# Patient Record
Sex: Male | Born: 1975 | Race: White | Hispanic: No | Marital: Married | Smoking: Never smoker
Health system: Southern US, Community
[De-identification: ages and names within clinical notes are randomized; demographics above are authoritative.]

## PROBLEM LIST (undated history)

## (undated) DIAGNOSIS — G35 Multiple sclerosis: Secondary | ICD-10-CM

---

## 2021-04-18 ENCOUNTER — Emergency Department (HOSPITAL_COMMUNITY): Payer: Medicare HMO

## 2021-04-18 ENCOUNTER — Emergency Department (HOSPITAL_COMMUNITY)
Admission: EM | Admit: 2021-04-18 | Discharge: 2021-04-19 | Disposition: A | Discharge status: Home / Self Care | Payer: Medicare HMO | Attending: Emergency Medicine | Admitting: Emergency Medicine

## 2021-04-18 ENCOUNTER — Other Ambulatory Visit: Payer: Self-pay

## 2021-04-18 ENCOUNTER — Encounter (HOSPITAL_COMMUNITY): Payer: Self-pay | Admitting: Emergency Medicine

## 2021-04-18 DIAGNOSIS — R471 Dysarthria and anarthria: Secondary | ICD-10-CM | POA: Insufficient documentation

## 2021-04-18 DIAGNOSIS — R531 Weakness: Secondary | ICD-10-CM | POA: Diagnosis present

## 2021-04-18 DIAGNOSIS — G35 Multiple sclerosis: Secondary | ICD-10-CM | POA: Insufficient documentation

## 2021-04-18 HISTORY — DX: Multiple sclerosis: G35

## 2021-04-18 LAB — BASIC METABOLIC PANEL
Anion gap: 10 (ref 5–15)
BUN: 12 mg/dL (ref 6–20)
CO2: 26 mmol/L (ref 22–32)
Calcium: 10.4 mg/dL — ABNORMAL HIGH (ref 8.9–10.3)
Chloride: 106 mmol/L (ref 98–111)
Creatinine, Ser: 0.93 mg/dL (ref 0.61–1.24)
GFR, Estimated: >60 mL/min (ref >60)
Glucose, Bld: 138 mg/dL — ABNORMAL HIGH (ref 70–99)
Potassium: 4.3 mmol/L (ref 3.5–5.1)
Sodium: 142 mmol/L (ref 135–145)

## 2021-04-18 LAB — CBC WITH DIFFERENTIAL/PLATELET
Abs Immature Granulocytes: 0.06 10*3/uL (ref 0.00–0.07)
Basophils Absolute: 0 10*3/uL (ref 0.0–0.1)
Basophils Relative: 0 %
Eosinophils Absolute: 0 10*3/uL (ref 0.0–0.5)
Eosinophils Relative: 0 %
HCT: 45.7 % (ref 39.0–52.0)
Hemoglobin: 15.4 g/dL (ref 13.0–17.0)
Immature Granulocytes: 1 %
Lymphocytes Relative: 9 %
Lymphs Abs: 1 10*3/uL (ref 0.7–4.0)
MCH: 31.9 pg (ref 26.0–34.0)
MCHC: 33.7 g/dL (ref 30.0–36.0)
MCV: 94.6 fL (ref 80.0–100.0)
Monocytes Absolute: 0.2 10*3/uL (ref 0.1–1.0)
Monocytes Relative: 2 %
Neutro Abs: 9.6 10*3/uL — ABNORMAL HIGH (ref 1.7–7.7)
Neutrophils Relative %: 88 %
Platelets: 357 10*3/uL (ref 150–400)
RBC: 4.83 MIL/uL (ref 4.22–5.81)
RDW: 13.4 % (ref 11.5–15.5)
WBC: 10.8 10*3/uL — ABNORMAL HIGH (ref 4.0–10.5)
nRBC: 0 % (ref 0.0–0.2)

## 2021-04-18 LAB — HEPATIC FUNCTION PANEL
ALT: 12 U/L (ref 0–44)
AST: 14 U/L — ABNORMAL LOW (ref 15–41)
Albumin: 4.8 g/dL (ref 3.5–5.0)
Alkaline Phosphatase: 61 U/L (ref 38–126)
Bilirubin, Direct: <0.1 mg/dL (ref 0.0–0.2)
Total Bilirubin: 0.3 mg/dL (ref 0.3–1.2)
Total Protein: 7.4 g/dL (ref 6.5–8.1)

## 2021-04-18 IMAGING — MR MR CERVICAL SPINE WO/W CM
7 of 8 series · 32 of 48 positions shown · IV contrast (Gadavist)
Comparison: None.

CLINICAL DATA: Multiple sclerosis

EXAM:
MRI CERVICAL SPINE WITHOUT AND WITH CONTRAST
TECHNIQUE: Multiplanar and multiecho pulse sequences of the cervical spine, to
include the craniocervical junction and cervicothoracic junction,
were obtained without and with intravenous contrast.
CONTRAST:  8mL GADAVIST GADOBUTROL 1 MMOL/ML IV SOLN

[Series 9: T2 · sagittal · 3.0mm · 0.69mm/px · 3 of 15 slices shown (1 of 2)]
[im 1/15]
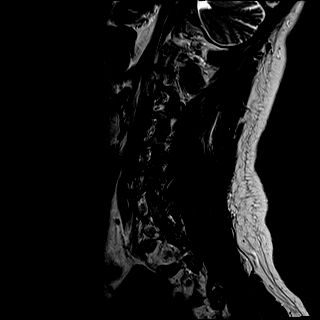
[im 8/15]
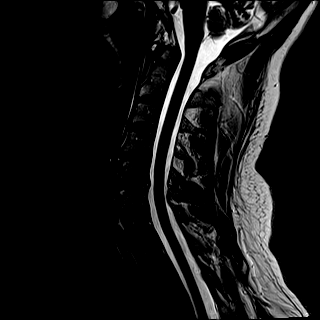
[im 15/15]
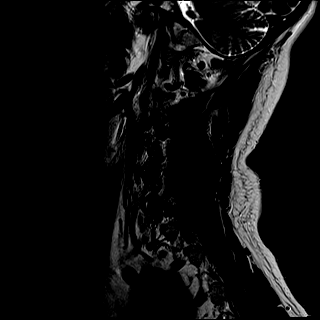

[Series 10: T1 · sagittal · 3.0mm · 0.69mm/px · 3 of 15 slices shown (1 of 2)]
[im 1/15]
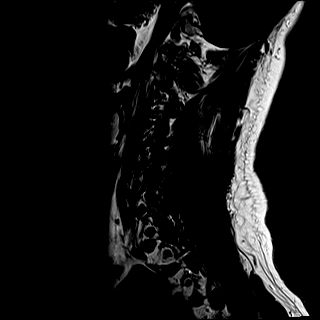
[im 8/15]
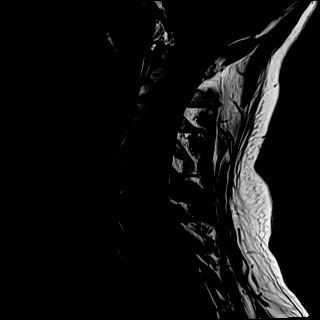
[im 15/15]
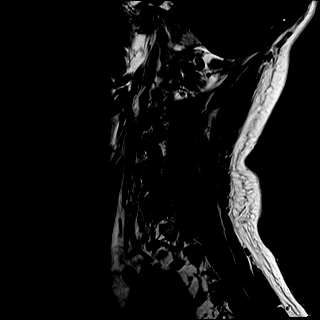

[Series 11: STIR · sagittal · 3.0mm · 0.86mm/px · 3 of 15 slices shown]
[im 1/15]
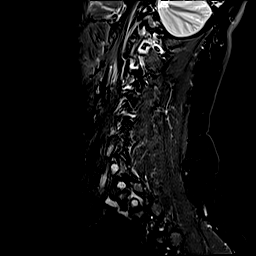
[im 8/15]
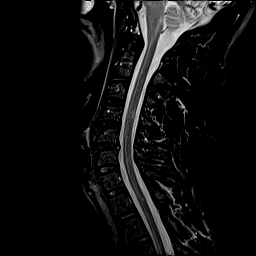
[im 15/15]
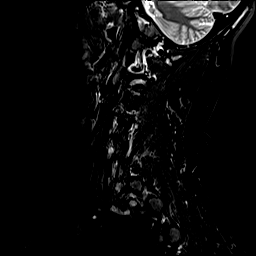

[Series 12: T2 · axial · 3.0mm · 0.66mm/px · z∈[-220,-92]mm · 9 of 41 slices shown (2 of 2)]
[im 1/41]
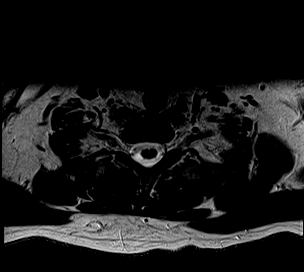
[im 6/41]
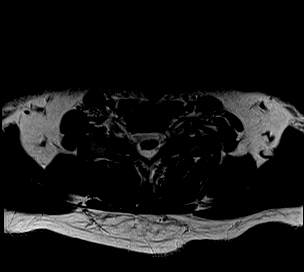
[im 11/41]
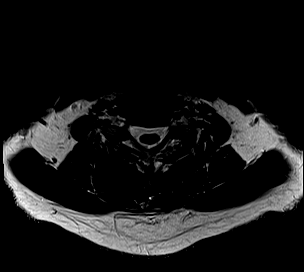
[im 16/41]
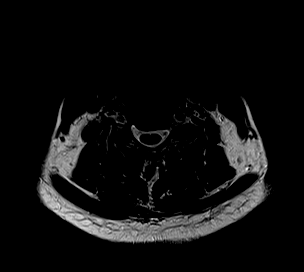
[im 21/41]
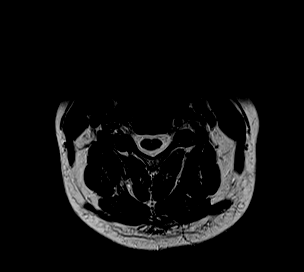
[im 26/41]
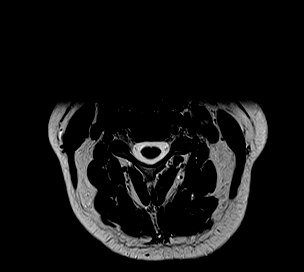
[im 31/41]
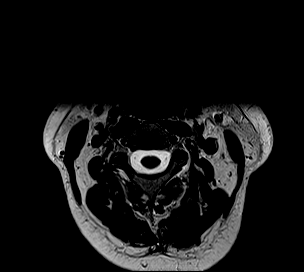
[im 36/41]
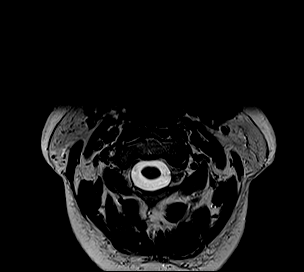
[im 41/41]
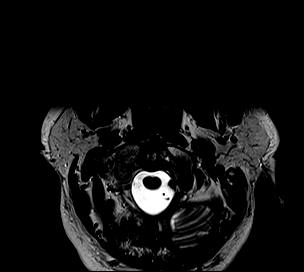

[Series 14: T1 · axial · 3.0mm · 0.39mm/px · z∈[-220,-92]mm · 9 of 42 slices shown (2 of 2)]
[im 1/42]
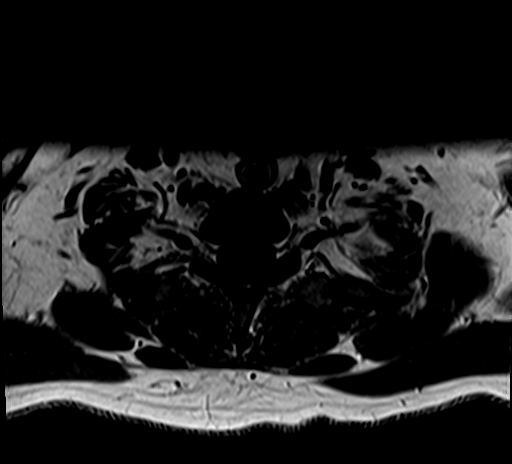
[im 6/42]
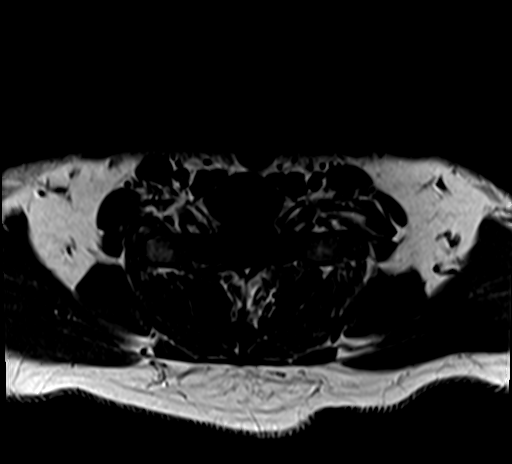
[im 11/42]
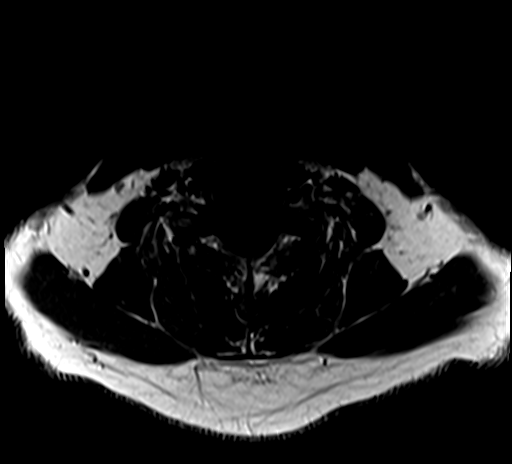
[im 16/42]
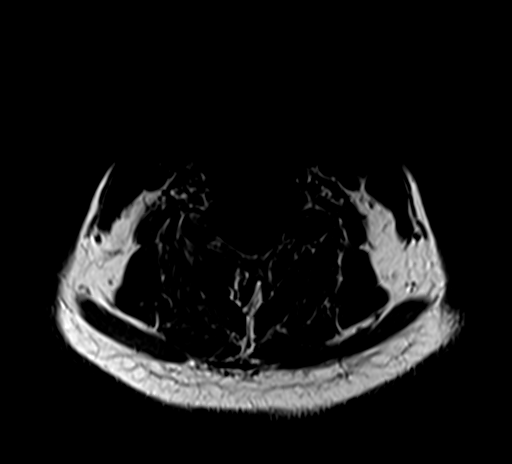
[im 21/42]
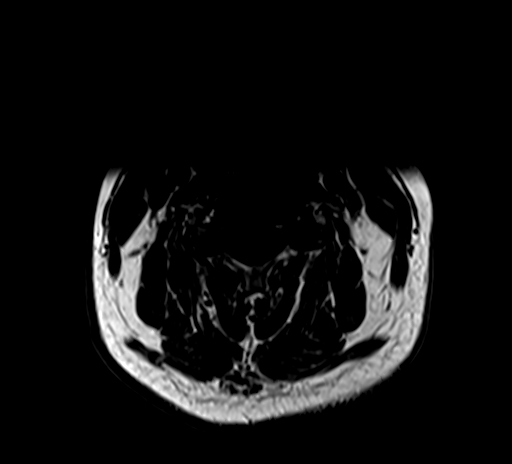
[im 26/42]
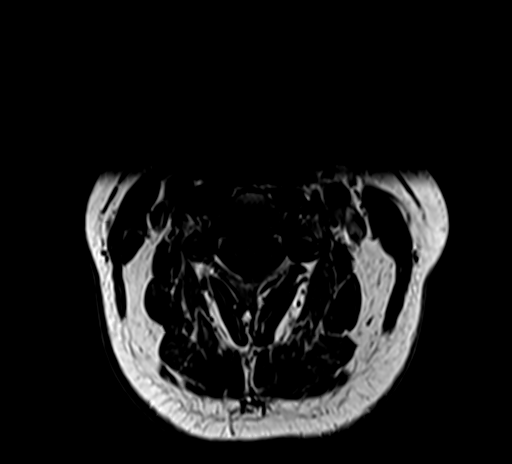
[im 31/42]
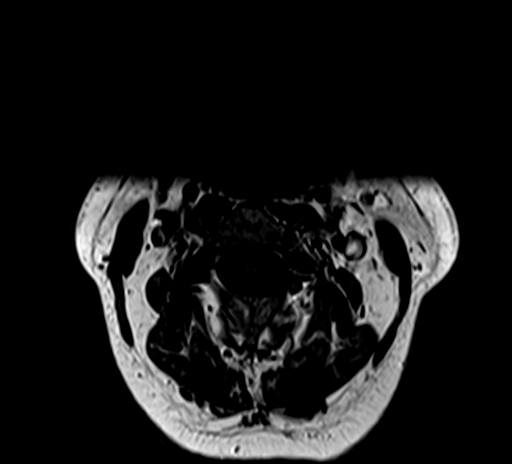
[im 36/42]
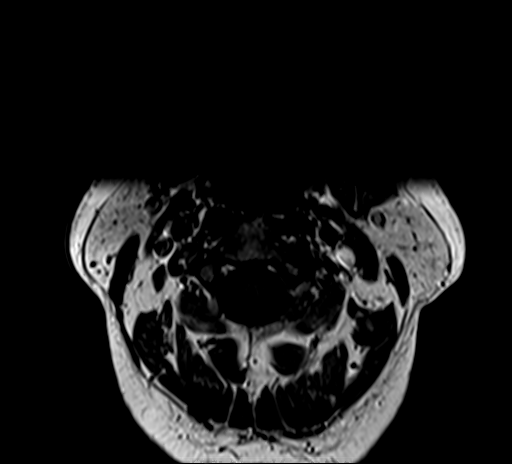
[im 42/42]
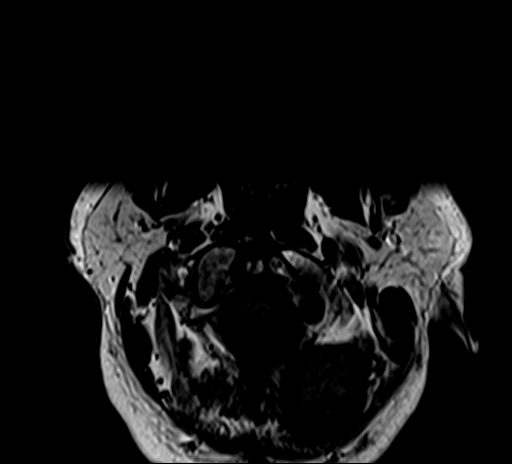

[Series 15: T1 fat-sat post-contrast · sagittal · 3.0mm · 0.43mm/px · 3 of 15 slices shown]
[im 1/15]
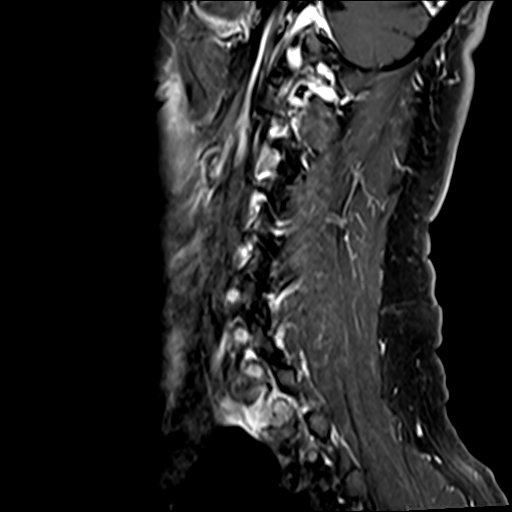
[im 8/15]
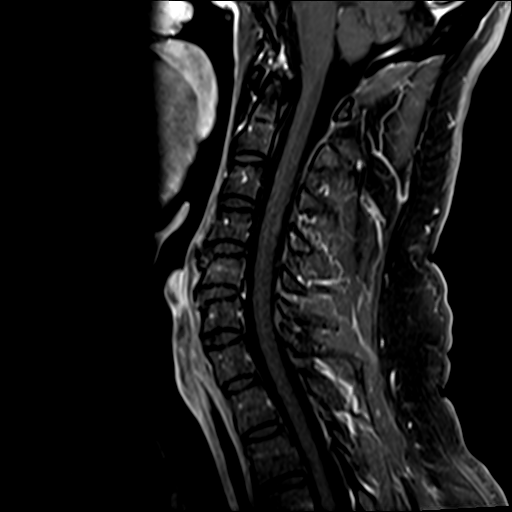
[im 15/15]
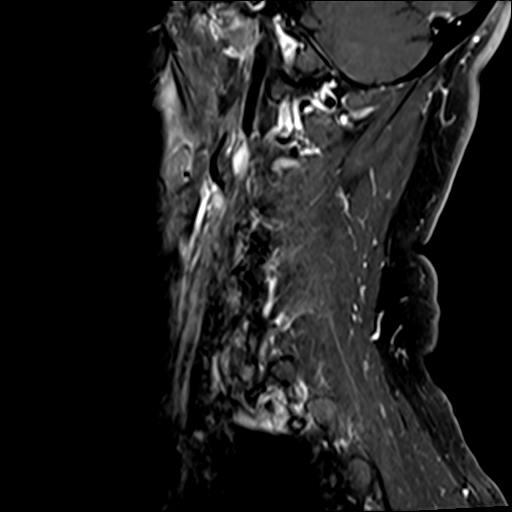

[Series 16: T1 post-contrast · axial · 3.0mm · 0.39mm/px · z∈[-220,-205]mm · 2 of 42 slices shown]
[im 1/42]
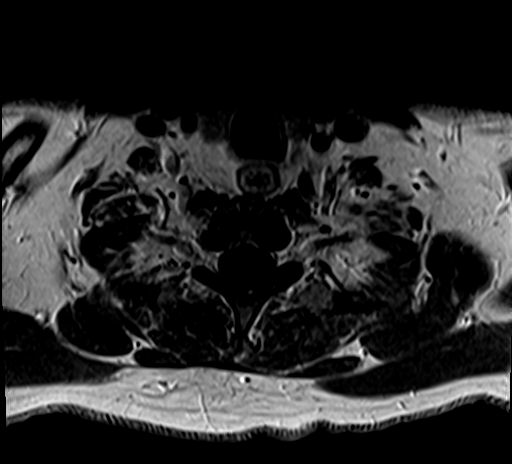
[im 6/42]
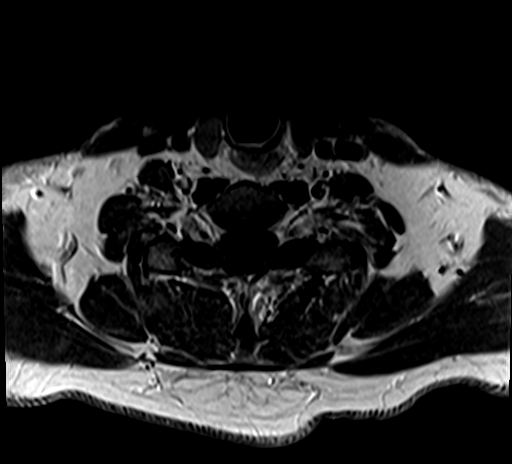

[32 of 48 positions shown; findings below may reference images not displayed]

FINDINGS: Alignment: Physiologic.

Vertebrae: No fracture, evidence of discitis, or bone lesion.

Cord: Normal signal and morphology.

Posterior Fossa, vertebral arteries, paraspinal tissues: Negative.

Disc levels:

Small central disc protrusion at C5-6 without spinal canal or neural
foraminal stenosis.
IMPRESSION: 1. Normal cervical spinal cord.
2. Small central disc protrusion at C5-6 without spinal canal or
neural foraminal stenosis.

## 2021-04-18 IMAGING — MR MR THORACIC SPINE WO/W CM
5 of 9 series · 23 of 48 positions shown · IV contrast (gadavist)
Comparison: None.

CLINICAL DATA: Multiple sclerosis

EXAM:
MRI THORACIC WITHOUT AND WITH CONTRAST
TECHNIQUE: Multiplanar and multiecho pulse sequences of the thoracic spine were
obtained without and with intravenous contrast.
CONTRAST:  8mL GADAVIST GADOBUTROL 1 MMOL/ML IV SOLN

[Series 18: T1 · sagittal · 3.3mm · 0.78mm/px · 2 of 13 slices shown (1 of 3)]
[im 1/13]
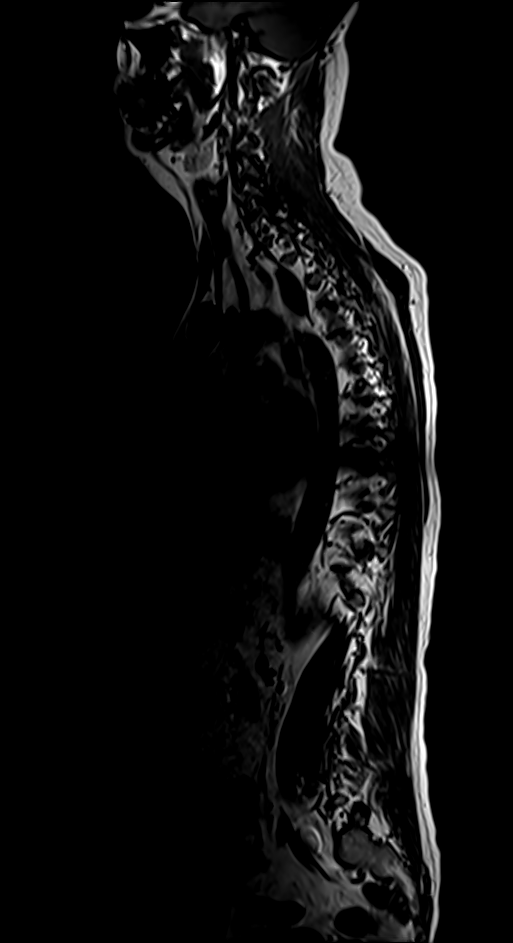
[im 13/13]
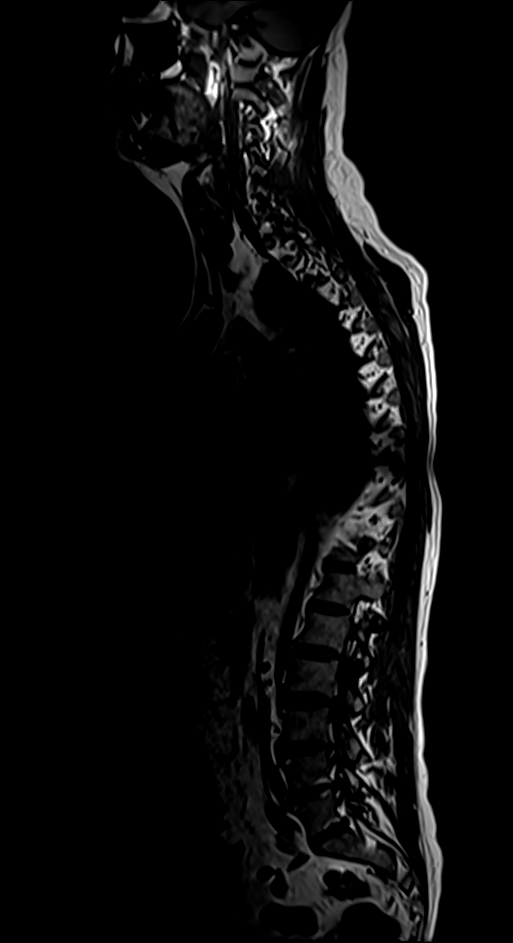

[Series 19: T2 · sagittal · 3.0mm · 0.81mm/px · 3 of 17 slices shown (1 of 2)]
[im 1/17]
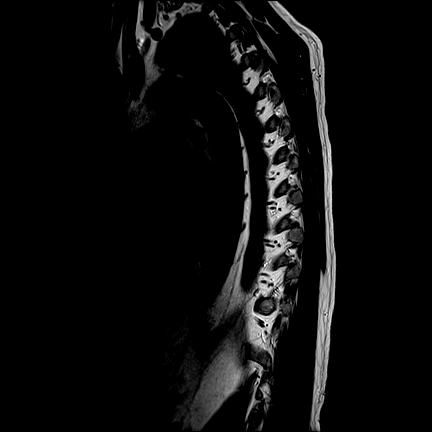
[im 9/17]
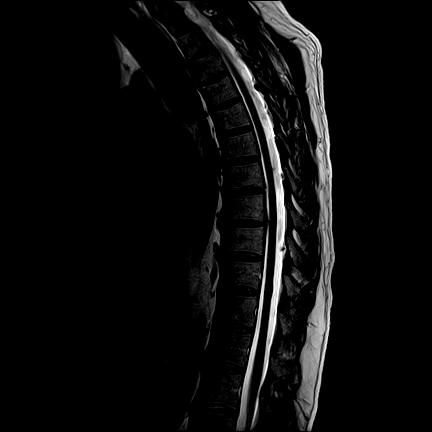
[im 17/17]
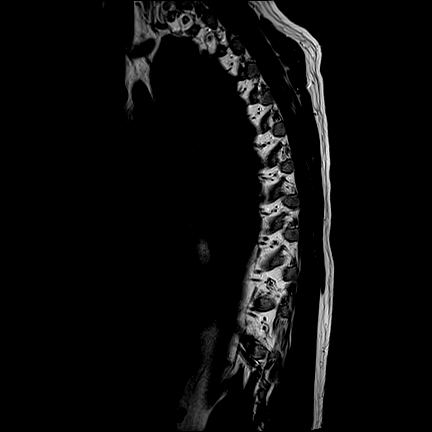

[Series 20: T1 · sagittal · 3.0mm · 0.81mm/px · 3 of 17 slices shown (2 of 3)]
[im 1/17]
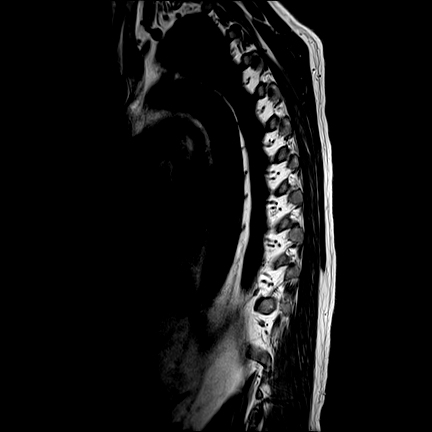
[im 9/17]
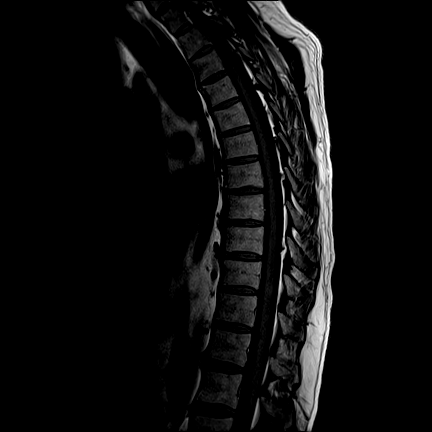
[im 17/17]
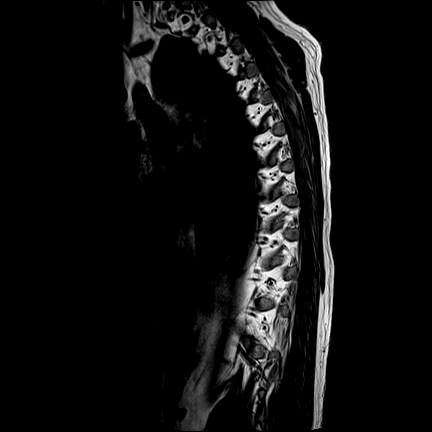

[Series 22: T2 · axial · 4.0mm · 0.62mm/px · z∈[-465,-240]mm · 8 of 36 slices shown (2 of 2)]
[im 1/36]
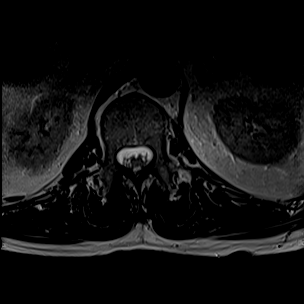
[im 6/36]
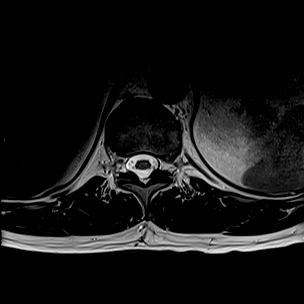
[im 11/36]
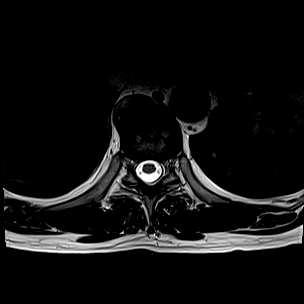
[im 16/36]
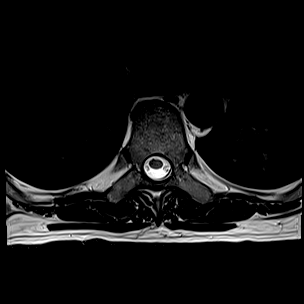
[im 21/36]
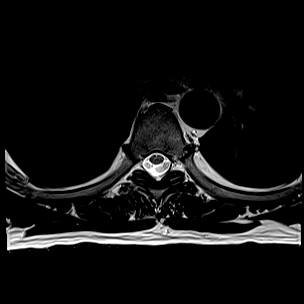
[im 26/36]
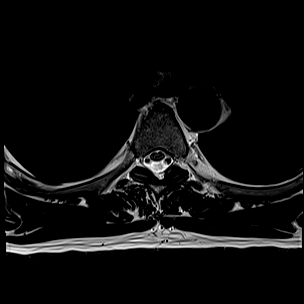
[im 31/36]
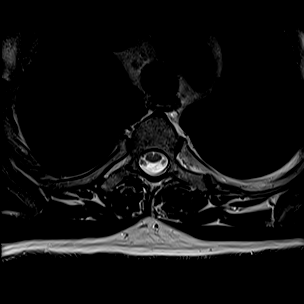
[im 36/36]
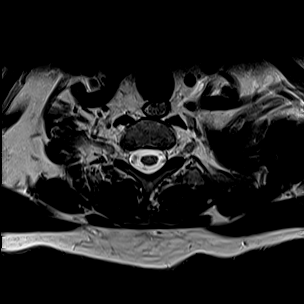

[Series 24: T1 · axial · non-contrast · 4.0mm · 0.31mm/px · z∈[-465,-268]mm · 7 of 36 slices shown (3 of 3)]
[im 1/36]
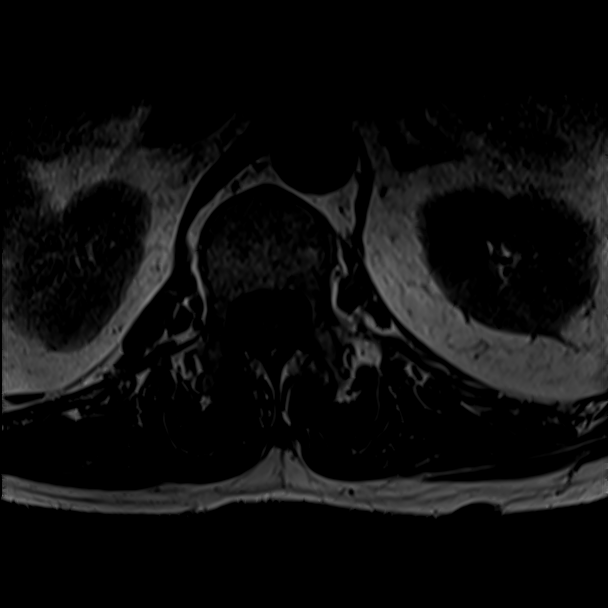
[im 6/36]
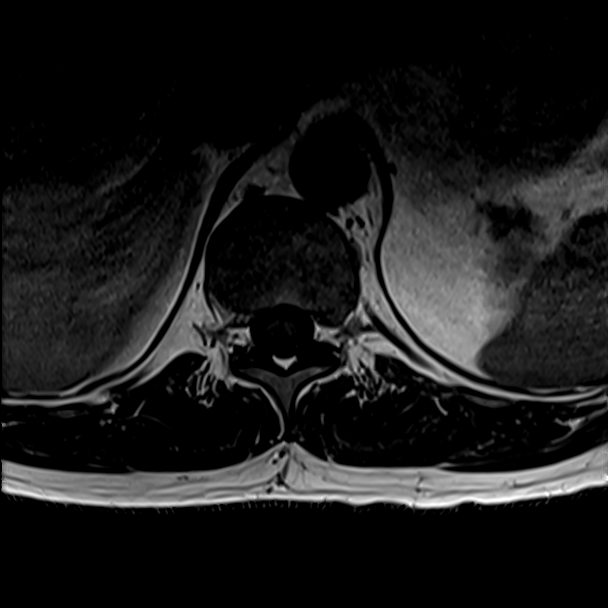
[im 11/36]
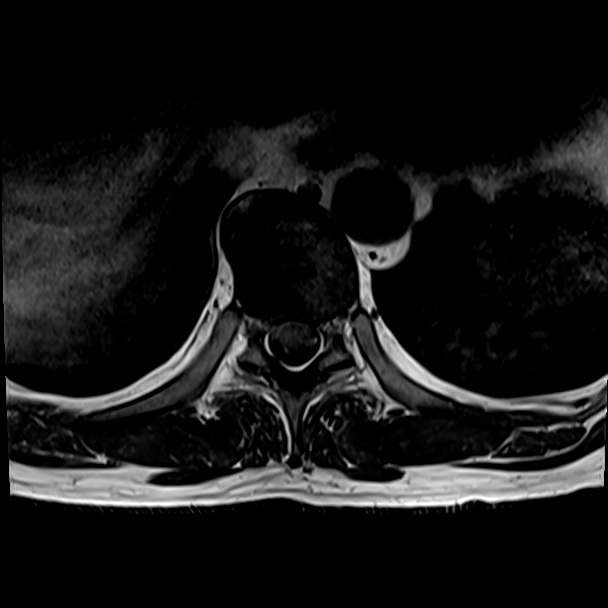
[im 16/36]
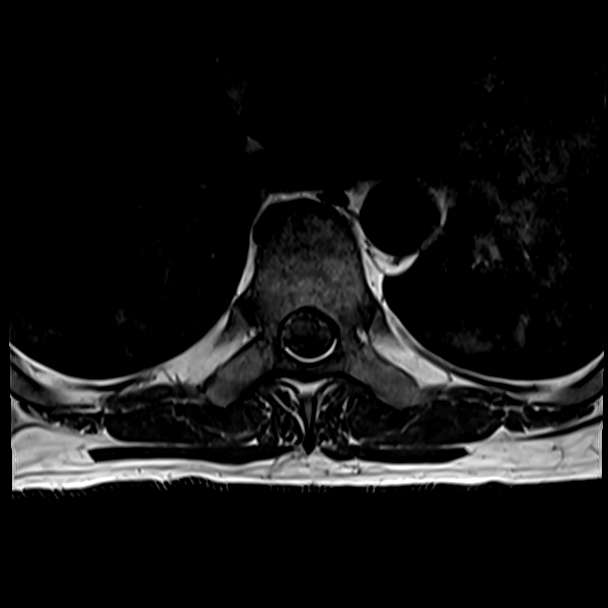
[im 21/36]
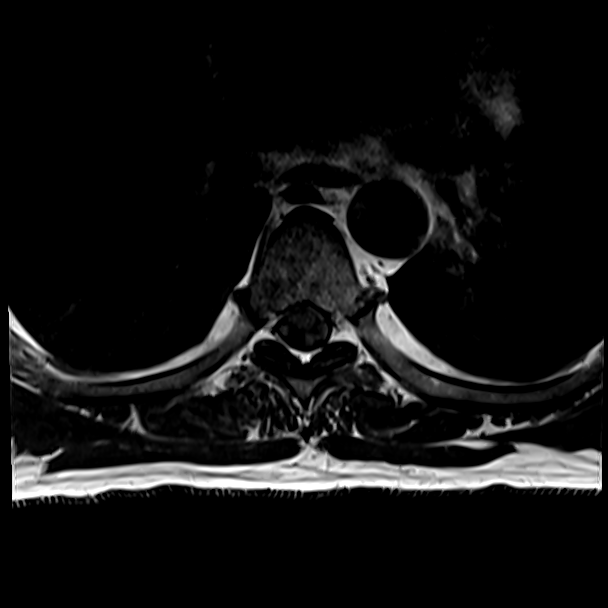
[im 26/36]
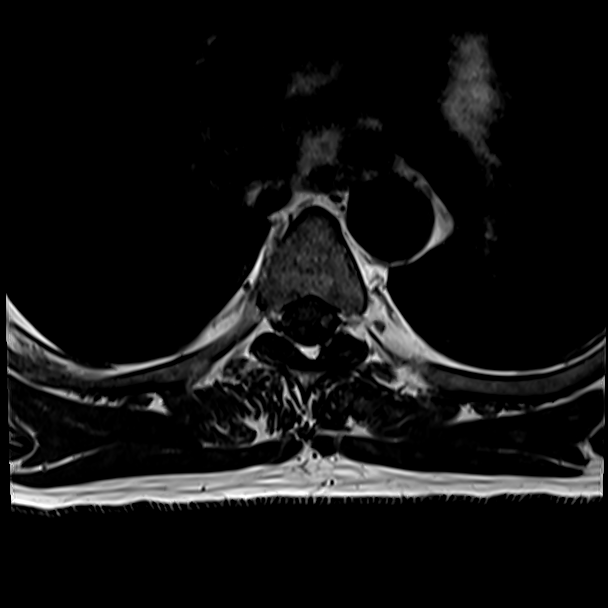
[im 31/36]
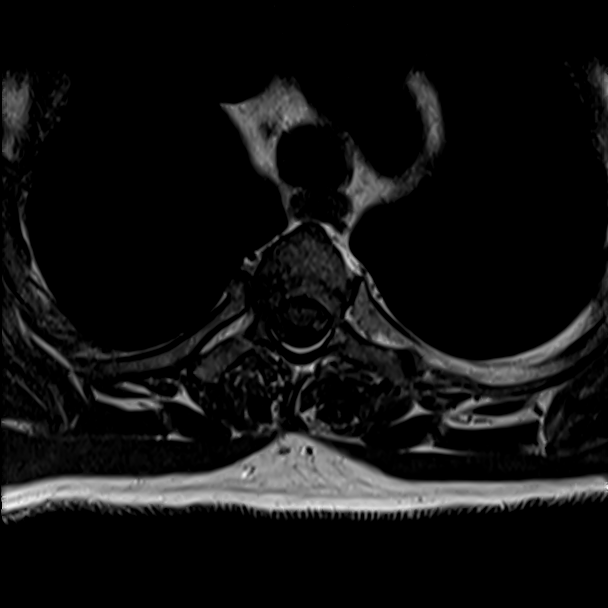

[23 of 48 positions shown; findings below may reference images not displayed]

FINDINGS: Alignment:  Physiologic.

Vertebrae: No fracture, evidence of discitis, or bone lesion.

Cord:  Normal signal and morphology.

Paraspinal and other soft tissues: Negative.

Disc levels:

Small central disc protrusion at T8-9.  No spinal canal stenosis.
IMPRESSION: No evidence of demyelinating disease within the thoracic spine.

## 2021-04-18 IMAGING — MR MR HEAD WO/W CM
23 of 26 series · 33 of 48 positions shown · IV contrast (gadavist)
Comparison: None.

CLINICAL DATA: Multiple sclerosis

EXAM:
MRI HEAD WITHOUT AND WITH CONTRAST
TECHNIQUE: Multiplanar, multiecho pulse sequences of the brain and surrounding
structures were obtained without and with intravenous contrast.
CONTRAST:  8mL GADAVIST GADOBUTROL 1 MMOL/ML IV SOLN

[Series 5: DWI · axial · 3.0mm · 0.88mm/px · z∈[-45,+102]mm · 2 of 100 slices shown (1 of 4)]
[im 1/100]
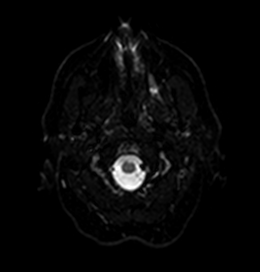
[im 100/100]
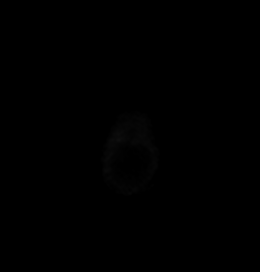

[Series 6: DWI · axial · 3.0mm · 0.88mm/px · z∈[-45,+102]mm · 2 of 49 slices shown (2 of 4)]
[im 1/49]
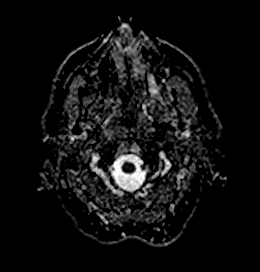
[im 49/49]
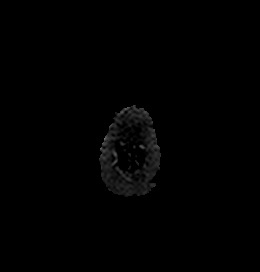

[Series 7: DWI · coronal · 4.0mm · 0.88mm/px · 3 of 76 slices shown (3 of 4)]
[im 1/76]
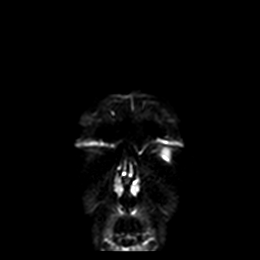
[im 38/76]
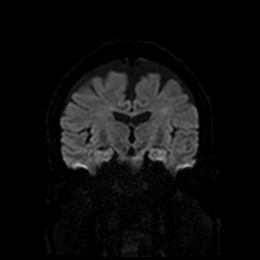
[im 76/76]
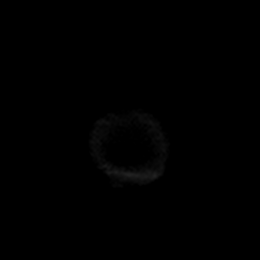

[Series 8: DWI · coronal · 4.0mm · 0.88mm/px · 1 of 38 slices shown (4 of 4)]
[im 1/38]
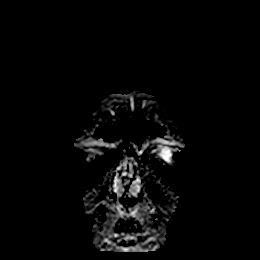

[Series 9: T1 · sagittal · 5.0mm · 0.75mm/px · 1 of 25 slices shown (1 of 3)]
[im 1/25]
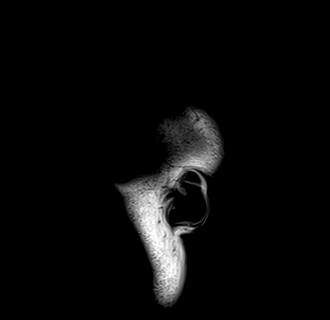

[Series 10: T2 · axial · 5.0mm · 0.72mm/px · 1 of 25 slices shown]
[im 1/25]
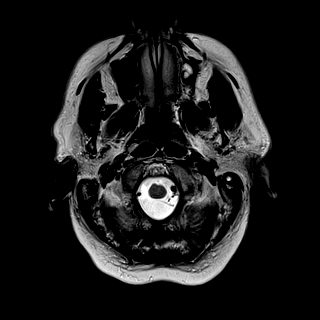

[Series 11: FLAIR · axial · 5.0mm · 0.45mm/px · 1 of 25 slices shown]
[im 1/25]
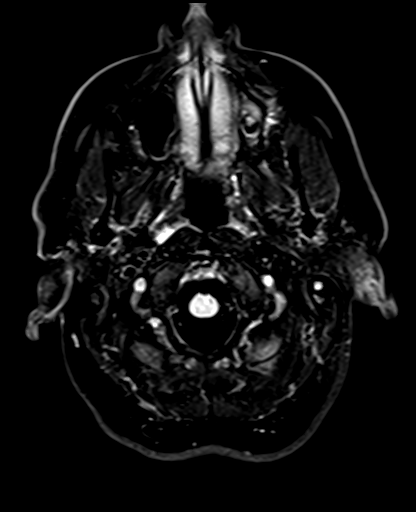

[Series 13: pha_images · axial · 3.0mm · 0.90mm/px · z∈[-56,+109]mm · 2 of 56 slices shown]
[im 1/56]
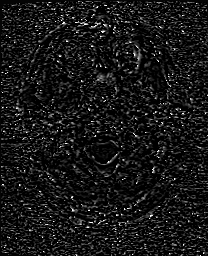
[im 56/56]
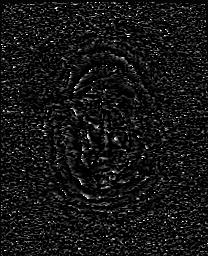

[Series 14: swi_images · axial · 3.0mm · 0.90mm/px · z∈[-56,+109]mm · 2 of 56 slices shown]
[im 1/56]
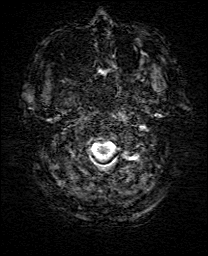
[im 56/56]
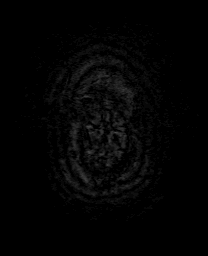

[Series 16: t2_space_dark-fluid_sag_p2_ns-ir · sagittal · 1.0mm · 0.49mm/px · 5 of 160 slices shown]
[im 1/160]
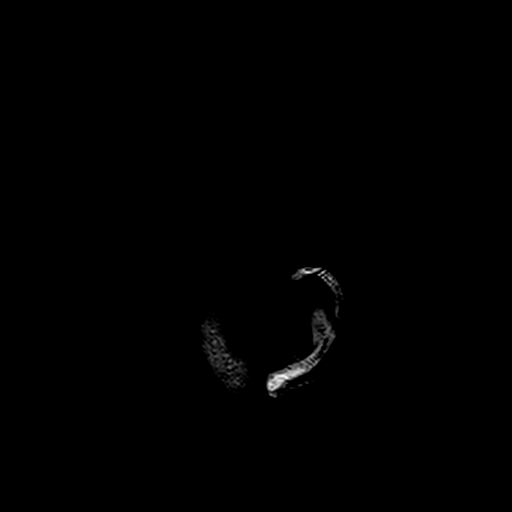
[im 40/160]
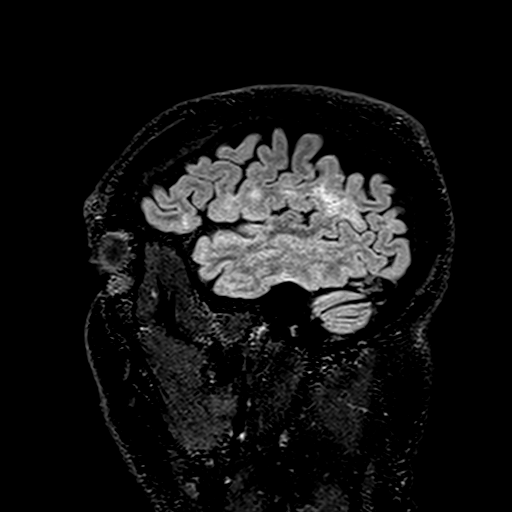
[im 80/160]
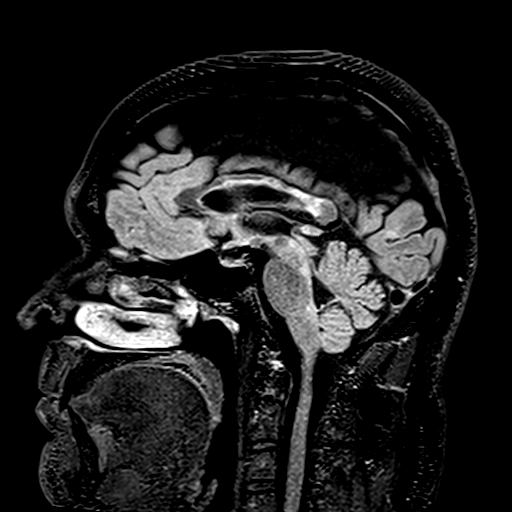
[im 120/160]
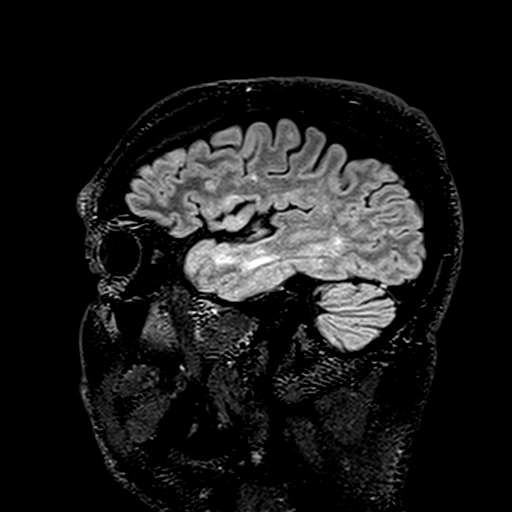
[im 160/160]
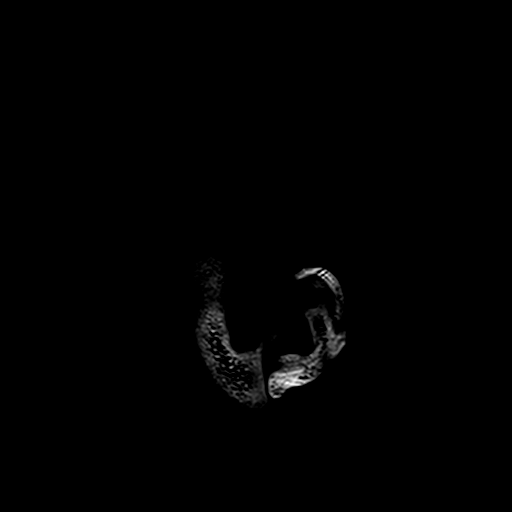

[Series 17: t2_space_dark-fluid_sag_p2_ns-ir_mpr_ axial · axial · 1.0mm · 0.45mm/px · 1 of 169 slices shown]
[im 1/169]
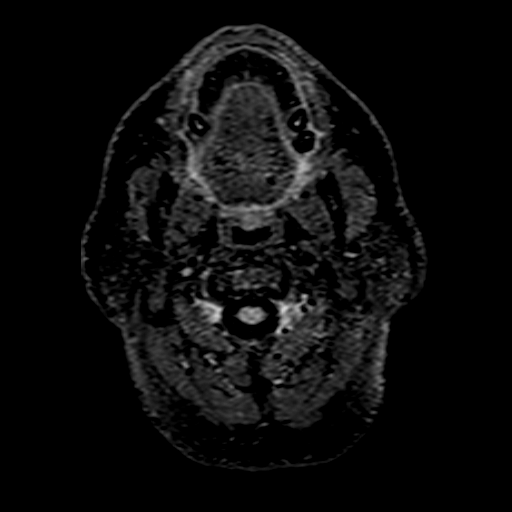

[Series 19: T1 · axial · non-contrast · 3.0mm · 0.37mm/px · 1 of 25 slices shown (2 of 3)]
[im 1/25]
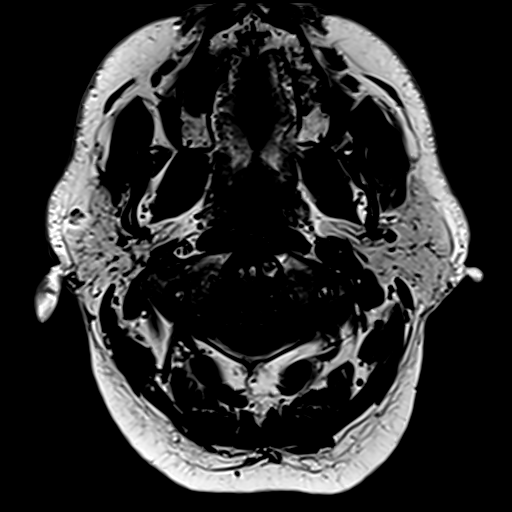

[Series 20: T2 fat-sat · axial · 3.0mm · 0.54mm/px · 1 of 25 slices shown (1 of 5)]
[im 1/25]
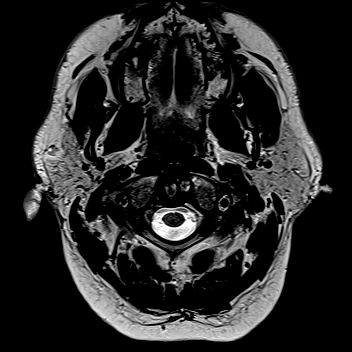

[Series 22: T2 fat-sat · axial · 3.0mm · 0.54mm/px · 1 of 25 slices shown (2 of 5)]
[im 1/25]
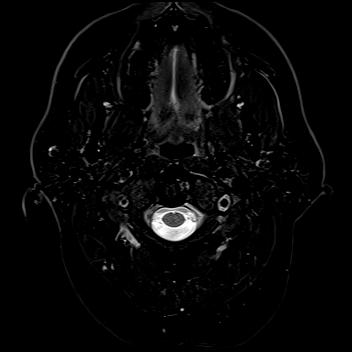

[Series 28: T2 fat-sat · coronal · 3.0mm · 0.59mm/px · 1 of 32 slices shown (3 of 5)]
[im 1/32]
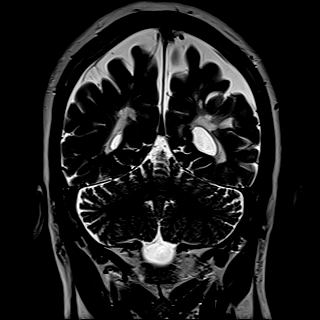

[Series 29: T2 fat-sat · coronal · 3.0mm · 0.59mm/px · 1 of 32 slices shown (4 of 5)]
[im 1/32]
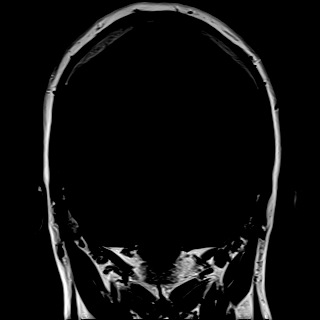

[Series 30: T2 fat-sat · coronal · 3.0mm · 0.59mm/px · 1 of 32 slices shown (5 of 5)]
[im 1/32]
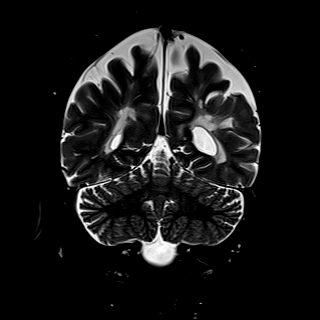

[Series 31: T1 · coronal · 3.0mm · 0.37mm/px · 1 of 32 slices shown (3 of 3)]
[im 1/32]
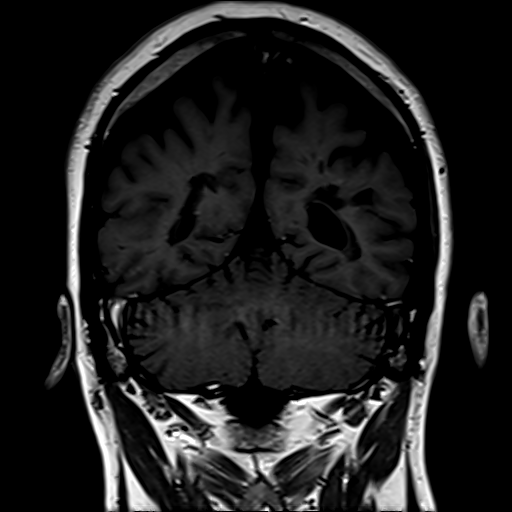

[Series 32: T2 post-contrast · coronal · 5.0mm · 0.72mm/px · 1 of 30 slices shown]
[im 1/30]
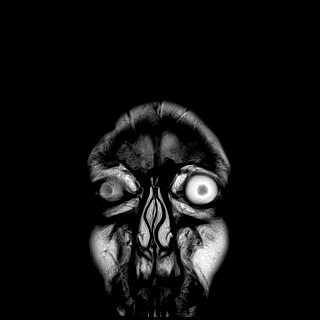

[Series 33: T1 fat-sat post-contrast · axial · 3.0mm · 0.37mm/px · 1 of 25 slices shown (1 of 2)]
[im 1/25]
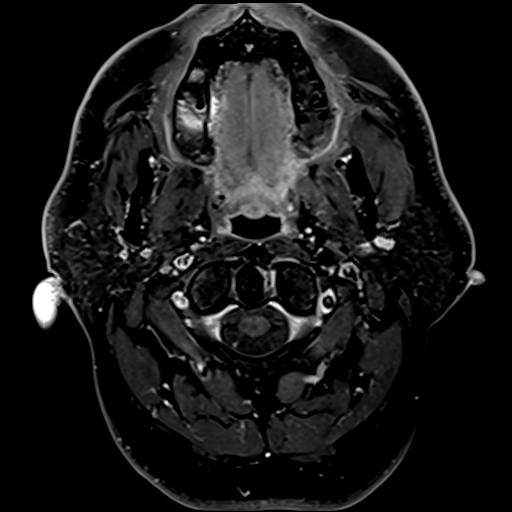

[Series 34: T1 fat-sat post-contrast · coronal · 3.0mm · 0.37mm/px · 1 of 32 slices shown (2 of 2)]
[im 1/32]
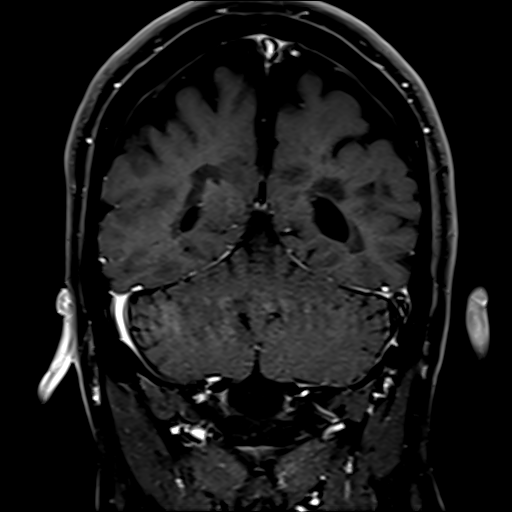

[Series 36: T1 post-contrast · coronal · 5.0mm · 0.34mm/px · 1 of 30 slices shown (1 of 2)]
[im 1/30]
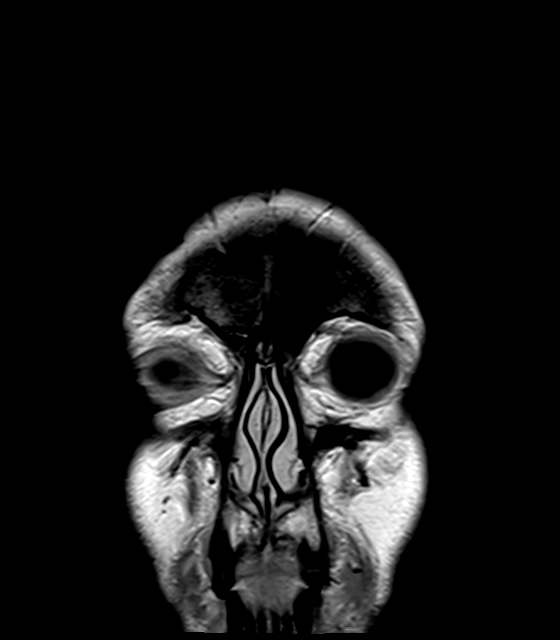

[Series 37: T1 post-contrast · sagittal · 5.0mm · 0.75mm/px · 1 of 25 slices shown (2 of 2)]
[im 1/25]
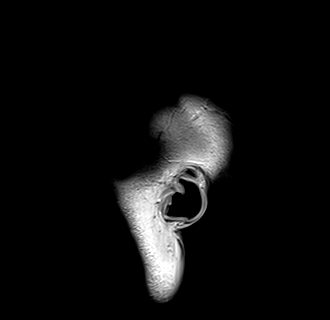

[33 of 48 positions shown; findings below may reference images not displayed]

FINDINGS: Brain: No acute infarct, mass effect or extra-axial collection. No
acute or chronic hemorrhage. Confluent periventricular white matter
hyperintensity with multiple lesions oriented perpendicularly to the
long axis of the lateral ventricles. Atrophy is greater than
expected for age. No abnormal contrast enhancement.

Vascular: Major flow voids are preserved.

Skull and upper cervical spine: Normal calvarium and skull base.
Visualized upper cervical spine and soft tissues are normal.

Sinuses/Orbits:No paranasal sinus fluid levels or advanced mucosal
thickening. No mastoid or middle ear effusion. Normal orbits.
IMPRESSION: Severe white matter disease in a pattern consistent with multiple
sclerosis. No abnormal contrast enhancement to suggest active
demyelination.

## 2021-04-18 IMAGING — MR MR ORBITS WO/W CM
23 of 26 series · 33 of 48 positions shown · IV contrast (Gadavist)
Comparison: No pertinent prior exam.

CLINICAL DATA: Multiple sclerosis and possible optic neuritis

EXAM:
MRI OF THE ORBITS WITHOUT AND WITH CONTRAST
TECHNIQUE: Multiplanar, multi-echo pulse sequences of the orbits and
surrounding structures were acquired including fat saturation
techniques, before and after intravenous contrast administration.
CONTRAST:  8mL GADAVIST GADOBUTROL 1 MMOL/ML IV SOLN

[Series 5: DWI · axial · 3.0mm · 0.88mm/px · z∈[-45,+102]mm · 2 of 100 slices shown (1 of 4)]
[im 1/100]
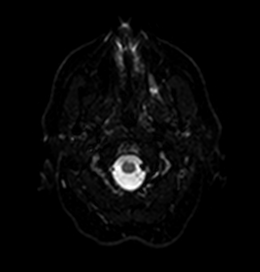
[im 100/100]
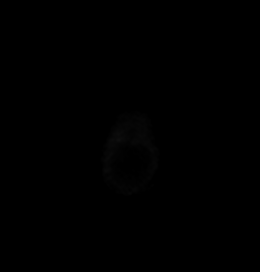

[Series 6: DWI · axial · 3.0mm · 0.88mm/px · z∈[-45,+102]mm · 2 of 49 slices shown (2 of 4)]
[im 1/49]
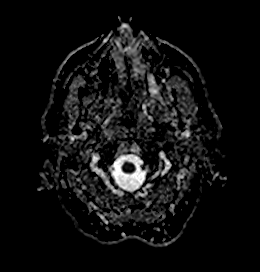
[im 49/49]
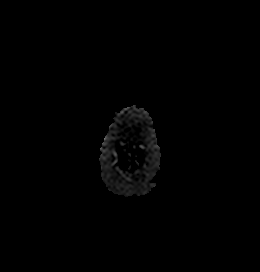

[Series 7: DWI · coronal · 4.0mm · 0.88mm/px · 3 of 76 slices shown (3 of 4)]
[im 1/76]
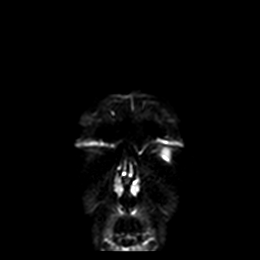
[im 38/76]
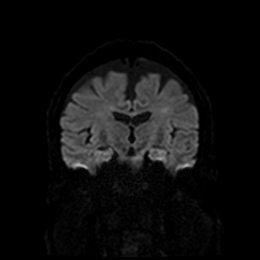
[im 76/76]
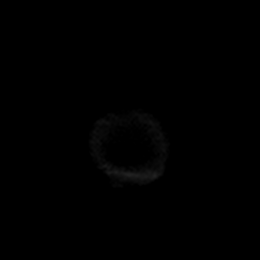

[Series 8: DWI · coronal · 4.0mm · 0.88mm/px · 1 of 38 slices shown (4 of 4)]
[im 1/38]
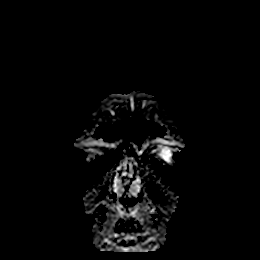

[Series 9: T1 · sagittal · 5.0mm · 0.75mm/px · 1 of 25 slices shown (1 of 3)]
[im 1/25]
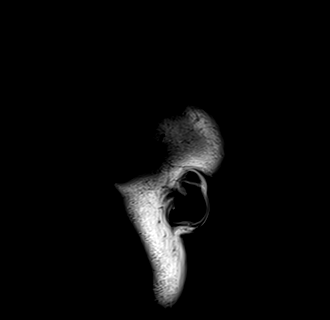

[Series 10: T2 · axial · 5.0mm · 0.72mm/px · 1 of 25 slices shown]
[im 1/25]
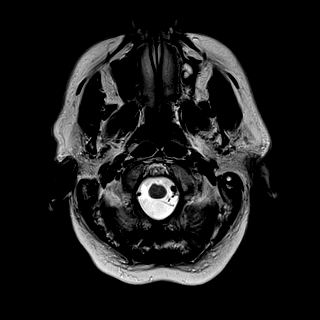

[Series 11: FLAIR · axial · 5.0mm · 0.45mm/px · 1 of 25 slices shown]
[im 1/25]
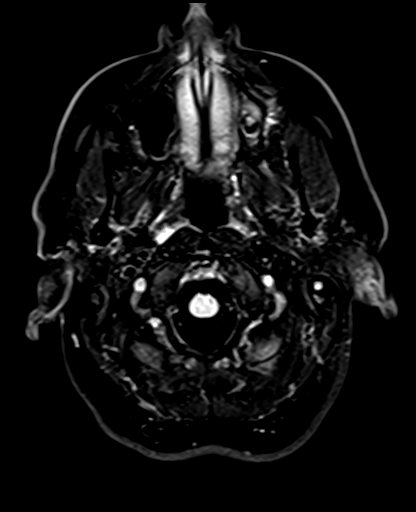

[Series 13: pha_images · axial · 3.0mm · 0.90mm/px · z∈[-56,+109]mm · 2 of 56 slices shown]
[im 1/56]
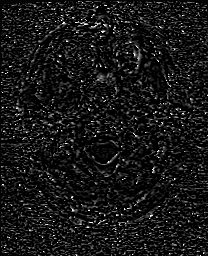
[im 56/56]
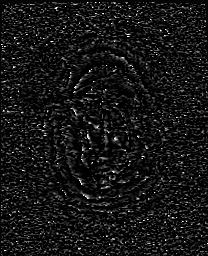

[Series 14: swi_images · axial · 3.0mm · 0.90mm/px · z∈[-56,+109]mm · 2 of 56 slices shown]
[im 1/56]
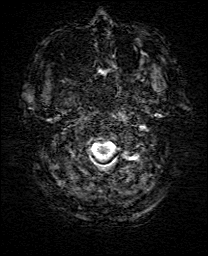
[im 56/56]
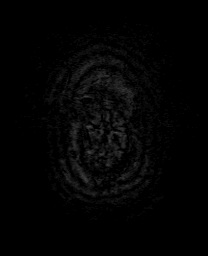

[Series 16: t2_space_dark-fluid_sag_p2_ns-ir · sagittal · 1.0mm · 0.49mm/px · 5 of 160 slices shown]
[im 1/160]
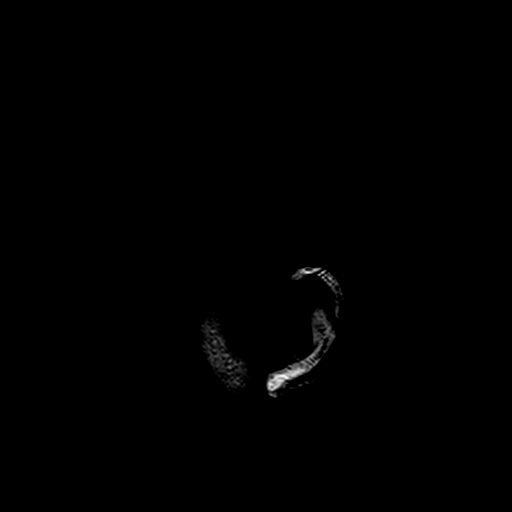
[im 40/160]
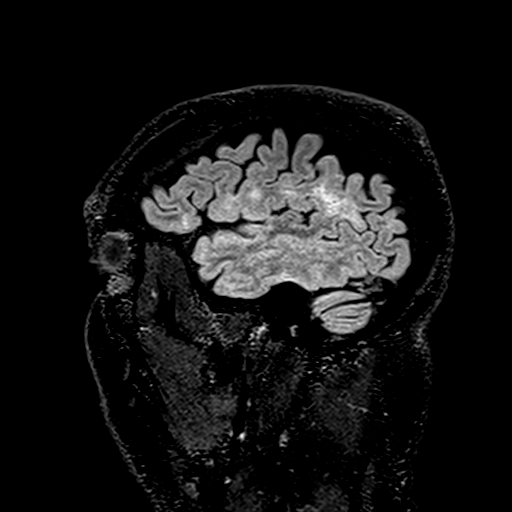
[im 80/160]
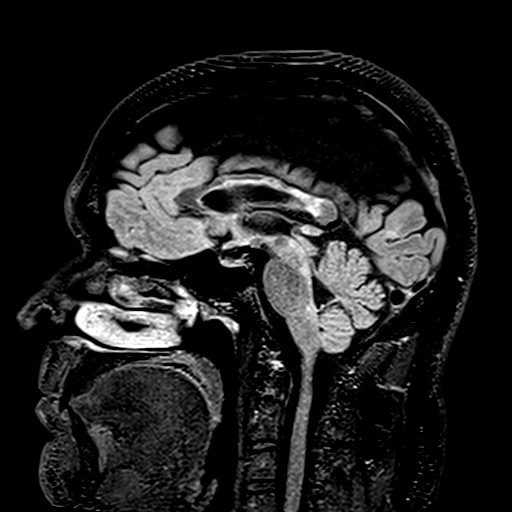
[im 120/160]
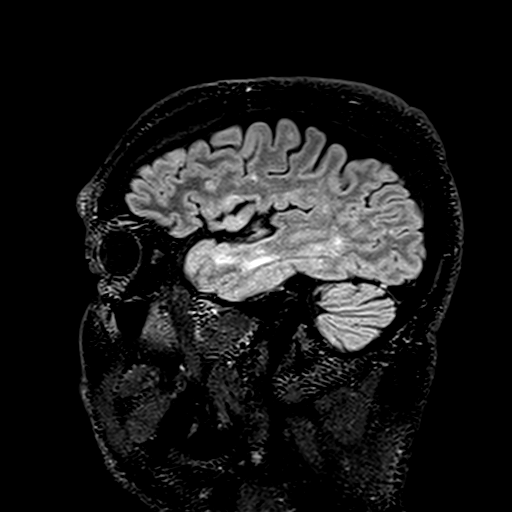
[im 160/160]
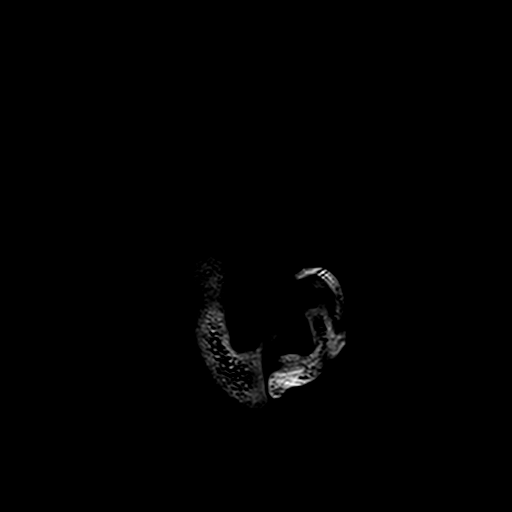

[Series 17: t2_space_dark-fluid_sag_p2_ns-ir_mpr_ axial · axial · 1.0mm · 0.45mm/px · 1 of 169 slices shown]
[im 1/169]
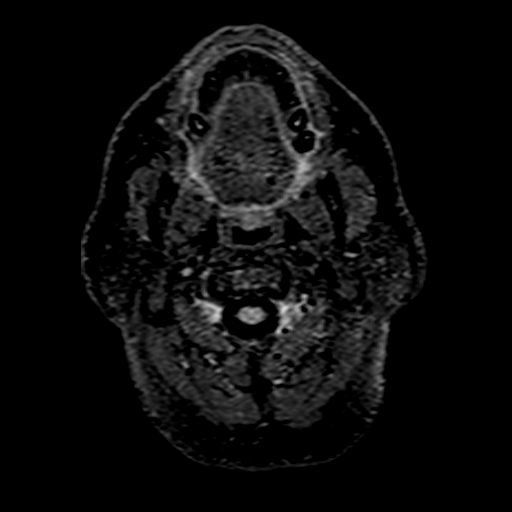

[Series 19: T1 · axial · non-contrast · 3.0mm · 0.37mm/px · 1 of 25 slices shown (2 of 3)]
[im 1/25]
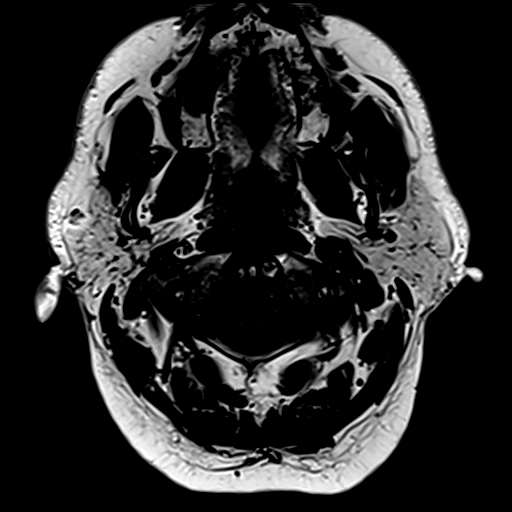

[Series 20: T2 fat-sat · axial · 3.0mm · 0.54mm/px · 1 of 25 slices shown (1 of 5)]
[im 1/25]
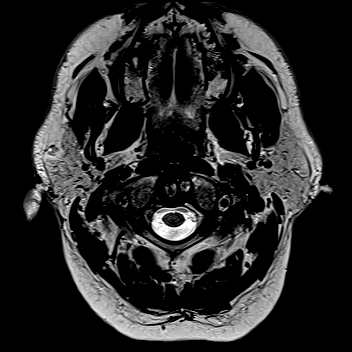

[Series 22: T2 fat-sat · axial · 3.0mm · 0.54mm/px · 1 of 25 slices shown (2 of 5)]
[im 1/25]
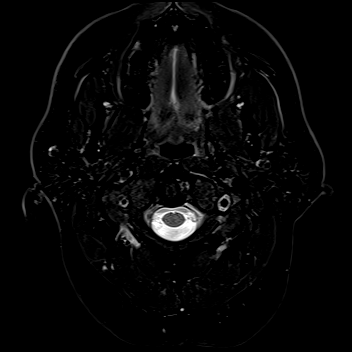

[Series 28: T2 fat-sat · coronal · 3.0mm · 0.59mm/px · 1 of 32 slices shown (3 of 5)]
[im 1/32]
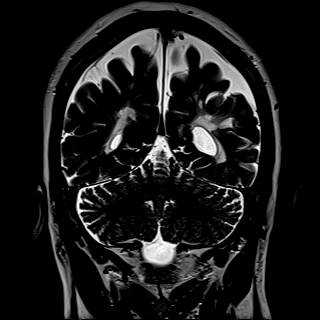

[Series 29: T2 fat-sat · coronal · 3.0mm · 0.59mm/px · 1 of 32 slices shown (4 of 5)]
[im 1/32]
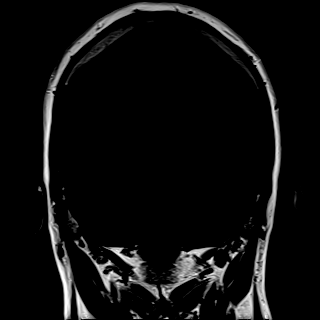

[Series 30: T2 fat-sat · coronal · 3.0mm · 0.59mm/px · 1 of 32 slices shown (5 of 5)]
[im 1/32]
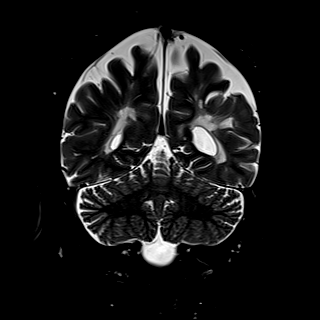

[Series 31: T1 · coronal · 3.0mm · 0.37mm/px · 1 of 32 slices shown (3 of 3)]
[im 1/32]
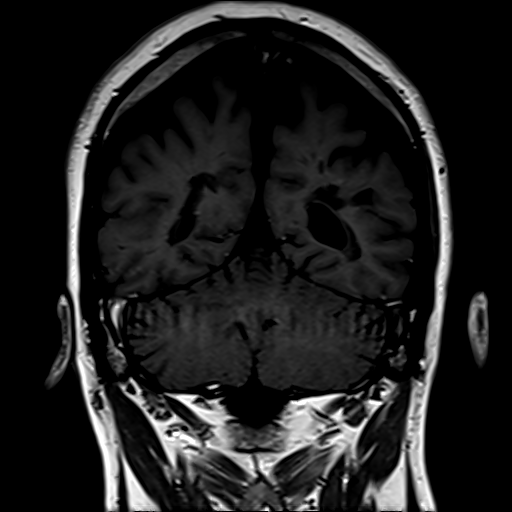

[Series 32: T2 post-contrast · coronal · 5.0mm · 0.72mm/px · 1 of 30 slices shown]
[im 1/30]
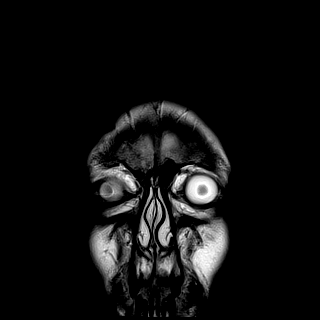

[Series 33: T1 fat-sat post-contrast · axial · 3.0mm · 0.37mm/px · 1 of 25 slices shown (1 of 2)]
[im 1/25]
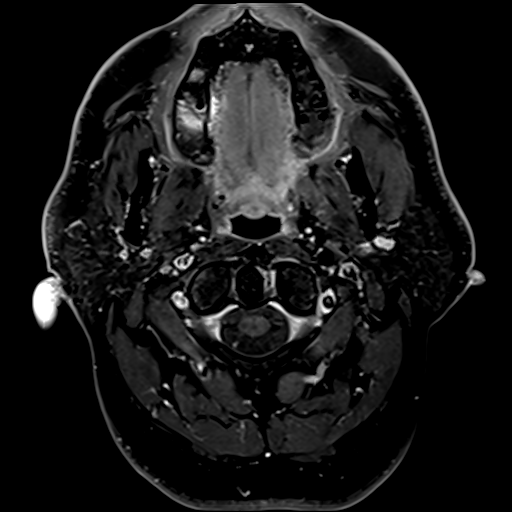

[Series 34: T1 fat-sat post-contrast · coronal · 3.0mm · 0.37mm/px · 1 of 32 slices shown (2 of 2)]
[im 1/32]
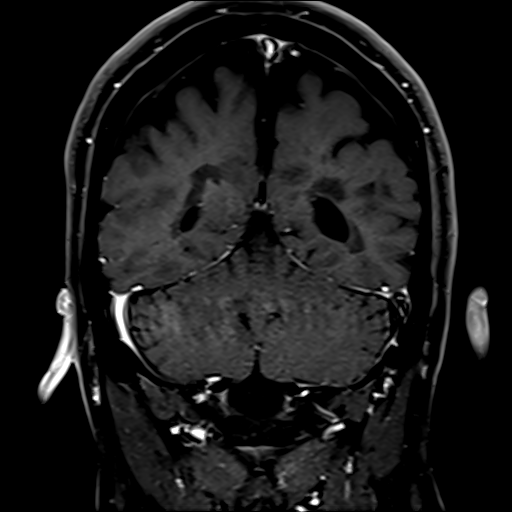

[Series 36: T1 post-contrast · coronal · 5.0mm · 0.34mm/px · 1 of 30 slices shown (1 of 2)]
[im 1/30]
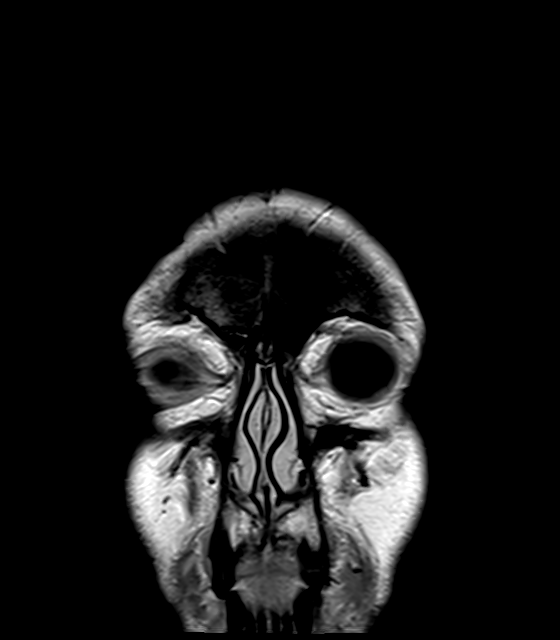

[Series 37: T1 post-contrast · sagittal · 5.0mm · 0.75mm/px · 1 of 25 slices shown (2 of 2)]
[im 1/25]
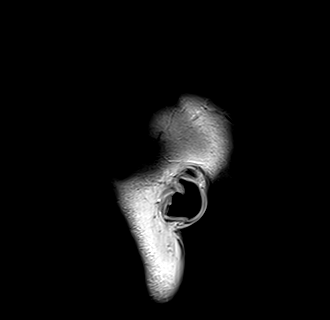

[33 of 48 positions shown; findings below may reference images not displayed]

FINDINGS: Orbits:

--Globes: Normal.

--Bony orbit: Normal.

--Preseptal soft tissues: Normal.

--Intra- and extraconal orbital fat: Normal. No inflammatory
stranding.

--Optic nerves: Normal.

--Lacrimal glands and fossae: Normal.

--Extraocular muscles: Normal.

Visualized sinuses:  No fluid levels or advanced mucosal thickening.

Soft tissues: Normal.
IMPRESSION: Normal MRI of the orbits.

## 2021-04-18 MED ORDER — GADOBUTROL 1 MMOL/ML IV SOLN
8.0000 mL | Freq: Once | INTRAVENOUS | Status: AC | PRN
  Administered 2021-04-18: 8 mL via INTRAVENOUS

## 2021-04-18 NOTE — ED Triage Notes (Signed)
Pt has a history of MS and gets an infusion twice per year in [place].  He is currently visiting from [place], [place] and believes he is having a flare-up over the past 4-5 days.  C/o bilateral leg numbness, arms tingling, and loss of peripheral vision in L eye.  Notified his Dr and taking Prednisone the past few days with no improvement.

## 2021-04-18 NOTE — ED Provider Notes (Signed)
Emergency Medicine Provider Triage Evaluation Note  William Abbott , a 45 y.o. male  was evaluated in triage.  Pt complains of MS flare.  He is followed by a clinic in [place].  He is here out of town visiting.  He called his doctor who prescribed him prednisone however despite taking that for a few days he feels like his symptoms have worsened.  He is endorsing bilateral leg weakness and left visual changes.  Review of Systems  Positive: Leg weakness, visual changes Negative: Fever, chills, headache  Physical Exam  BP 127/88 (BP Location: Left Arm)   Pulse 65   Temp 98.4 F (36.9 C) (Oral)   Resp 17   SpO2 98%  Gen:   Awake, no distress   Resp:  Normal effort  MSK:   Moves extremities without difficulty  Other:  Slow gait  Medical Decision Making  Medically screening exam initiated at 5:15 PM.  Appropriate orders placed.  Jerimie Mancuso was informed that the remainder of the evaluation will be completed by another provider, this initial triage assessment does not replace that evaluation, and the importance of remaining in the ED until their evaluation is complete.  Consulted neurology Dr. Iver Nestle  to discuss what MRs to order. She is pending MRI brain, orbits, cervical, and thoracic spine. Basic labs ordered.   Shanon Ace, PA-C 04/18/21 1800    Zadie Rhine, MD 04/19/21 [phone]
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
## 2021-04-19 DIAGNOSIS — G35 Multiple sclerosis: Secondary | ICD-10-CM

## 2021-04-19 MED ORDER — ACETAMINOPHEN 325 MG PO TABS
650.0000 mg | ORAL_TABLET | Freq: Once | ORAL | Status: AC
  Administered 2021-04-19: 650 mg via ORAL
  Filled 2021-04-19: qty 2

## 2021-04-19 NOTE — ED Provider Notes (Signed)
[facility] Provider Note   CSN: [id number] Arrival date & time: 04/18/21  1551     History Chief Complaint  Patient presents with  . Multiple Sclerosis    [facility] is a 45 y.o. male.  HPI    Patient with history of multiple sclerosis presents with possible MS flare.  He reports for the past 4 days he has been having bilateral arm and leg weakness.  He also reports worsening changes of vision in his left eye.  He reports chronic visual changes, but it is worse.  He is able to ambulate but it is very slow.  No falls.  No incontinence. His course is worsening.  He receives his care in [place], and he is from out of town Nothing improves his symptoms Past Medical History:  Diagnosis Date  . MS (multiple sclerosis) (HCC)     There are no problems to display for this patient.   History reviewed. No pertinent surgical history.     No family history on file.  Social History   Tobacco Use  . Smoking status: Never Smoker  . Smokeless tobacco: Never Used  Substance Use Topics  . Alcohol use: Not Currently  . Drug use: Not Currently    Home Medications Prior to Admission medications   Not on File    Allergies    Patient has no allergy information on record.  Review of Systems   Review of Systems  Constitutional: Negative for fever.  Eyes: Positive for visual disturbance.  Respiratory: Negative for shortness of breath.   Cardiovascular: Negative for chest pain.  Gastrointestinal: Negative for vomiting.  Neurological: Positive for weakness.  All other systems reviewed and are negative.   Physical Exam Updated Vital Signs BP 132/80 (BP Location: Right Arm)   Pulse 63   Temp 97.8 F (36.6 C) (Oral)   Resp 18   SpO2 97%   Physical Exam CONSTITUTIONAL: Well developed/well nourished HEAD: Normocephalic/atraumatic EYES: PERRL ENMT: Mucous membranes moist NECK: supple no meningeal signs SPINE/BACK:entire spine  nontender CV: S1/S2 noted, no murmurs/rubs/gallops noted LUNGS: Lungs are clear to auscultation bilaterally, no apparent distress ABDOMEN: soft, nontender, no rebound or guarding, bowel sounds noted throughout abdomen GU:no cva tenderness NEURO: Pt is awake/alert/appropriate, moves all extremitiesx4.  No facial droop.   EXTREMITIES: pulses normal/equal, full ROM SKIN: warm, color normal PSYCH: no abnormalities of mood noted, alert and oriented to situation  ED Results / Procedures / Treatments   Labs (all labs ordered are listed, but only abnormal results are displayed) Labs Reviewed  BASIC METABOLIC PANEL - Abnormal; Notable for the following components:      Result Value   Glucose, Bld 138 (*)    Calcium 10.4 (*)    All other components within normal limits  CBC WITH DIFFERENTIAL/PLATELET - Abnormal; Notable for the following components:   WBC 10.8 (*)    Neutro Abs 9.6 (*)    All other components within normal limits  HEPATIC FUNCTION PANEL - Abnormal; Notable for the following components:   AST 14 (*)    All other components within normal limits  RAPID URINE DRUG SCREEN, HOSP PERFORMED  URINALYSIS, ROUTINE W REFLEX MICROSCOPIC    EKG None  Radiology MR Brain W and Wo Contrast  Result Date: 04/18/2021 CLINICAL DATA:  Multiple sclerosis EXAM: MRI HEAD WITHOUT AND WITH CONTRAST TECHNIQUE: Multiplanar, multiecho pulse sequences of the brain and surrounding structures were obtained without and with intravenous contrast. CONTRAST:  30mL GADAVIST  GADOBUTROL 1 MMOL/ML IV SOLN COMPARISON:  None. FINDINGS: Brain: No acute infarct, mass effect or extra-axial collection. No acute or chronic hemorrhage. Confluent periventricular white matter hyperintensity with multiple lesions oriented perpendicularly to the long axis of the lateral ventricles. Atrophy is greater than expected for age. No abnormal contrast enhancement. Vascular: Major flow voids are preserved. Skull and upper cervical  spine: Normal calvarium and skull base. Visualized upper cervical spine and soft tissues are normal. Sinuses/Orbits:No paranasal sinus fluid levels or advanced mucosal thickening. No mastoid or middle ear effusion. Normal orbits. IMPRESSION: Severe white matter disease in a pattern consistent with multiple sclerosis. No abnormal contrast enhancement to suggest active demyelination. Electronically Signed   By: Deatra Robinson M.D.   On: 04/18/2021 22:12   MR Cervical Spine W or Wo Contrast  Result Date: 04/18/2021 CLINICAL DATA:  Multiple sclerosis EXAM: MRI CERVICAL SPINE WITHOUT AND WITH CONTRAST TECHNIQUE: Multiplanar and multiecho pulse sequences of the cervical spine, to include the craniocervical junction and cervicothoracic junction, were obtained without and with intravenous contrast. CONTRAST:  39mL GADAVIST GADOBUTROL 1 MMOL/ML IV SOLN COMPARISON:  None. FINDINGS: Alignment: Physiologic. Vertebrae: No fracture, evidence of discitis, or bone lesion. Cord: Normal signal and morphology. Posterior Fossa, vertebral arteries, paraspinal tissues: Negative. Disc levels: Small central disc protrusion at C5-6 without spinal canal or neural foraminal stenosis. IMPRESSION: 1. Normal cervical spinal cord. 2. Small central disc protrusion at C5-6 without spinal canal or neural foraminal stenosis. Electronically Signed   By: Deatra Robinson M.D.   On: 04/18/2021 22:16   MR THORACIC SPINE W WO CONTRAST  Result Date: 04/18/2021 CLINICAL DATA:  Multiple sclerosis EXAM: MRI THORACIC WITHOUT AND WITH CONTRAST TECHNIQUE: Multiplanar and multiecho pulse sequences of the thoracic spine were obtained without and with intravenous contrast. CONTRAST:  69mL GADAVIST GADOBUTROL 1 MMOL/ML IV SOLN COMPARISON:  None. FINDINGS: Alignment:  Physiologic. Vertebrae: No fracture, evidence of discitis, or bone lesion. Cord:  Normal signal and morphology. Paraspinal and other soft tissues: Negative. Disc levels: Small central disc protrusion at  T8-9.  No spinal canal stenosis. IMPRESSION: No evidence of demyelinating disease within the thoracic spine. Electronically Signed   By: Deatra Robinson M.D.   On: 04/18/2021 22:14   MR ORBITS W WO CONTRAST  Result Date: 04/18/2021 CLINICAL DATA:  Multiple sclerosis and possible optic neuritis EXAM: MRI OF THE ORBITS WITHOUT AND WITH CONTRAST TECHNIQUE: Multiplanar, multi-echo pulse sequences of the orbits and surrounding structures were acquired including fat saturation techniques, before and after intravenous contrast administration. CONTRAST:  31mL GADAVIST GADOBUTROL 1 MMOL/ML IV SOLN COMPARISON:  No pertinent prior exam. FINDINGS: Orbits: --Globes: Normal. --Bony orbit: Normal. --Preseptal soft tissues: Normal. --Intra- and extraconal orbital fat: Normal. No inflammatory stranding. --Optic nerves: Normal. --Lacrimal glands and fossae: Normal. --Extraocular muscles: Normal. Visualized sinuses:  No fluid levels or advanced mucosal thickening. Soft tissues: Normal. IMPRESSION: Normal MRI of the orbits. Electronically Signed   By: Deatra Robinson M.D.   On: 04/18/2021 22:29    Procedures Procedures   Medications Ordered in ED Medications  gadobutrol (GADAVIST) 1 MMOL/ML injection 8 mL (8 mLs Intravenous Contrast Given 04/18/21 2127)  acetaminophen (TYLENOL) tablet 650 mg (650 mg Oral Given 04/19/21 0130)    ED Course  I have reviewed the triage vital signs and the nursing notes.  Pertinent labs & imaging results that were available during my care of the patient were reviewed by me and considered in my medical decision making (see chart for details).  MDM Rules/Calculators/A&P                         2:45 AM Discussed MRI findings with Dr. Otelia Limes. Patient has been seen by the previous neurologist.  Since there is no new obvious signs of acute flare on MRI, he does not recommend any oral or IV Solu-Medrol at this time Urine studies are pending at this time 3:10 AM Patient now is requesting  discharge home.  I did offer to try to arrange outpatient Solu-Medrol infusion, but he prefers to go home and will try to arrange this himself.  Urgent referral to outpatient neurology has been placed Patient is awake alert, standing on his own and he feels appropriate for discharge Final Clinical Impression(s) / ED Diagnoses Final diagnoses:  Multiple sclerosis [facility])    Rx / DC Orders ED Discharge Orders    None       Zadie Rhine, MD 04/19/21 [phone]

## 2021-04-20 ENCOUNTER — Telehealth: Payer: Self-pay | Admitting: Pharmacy Technician

## 2021-04-20 ENCOUNTER — Other Ambulatory Visit: Payer: Self-pay | Admitting: Pulmonary Disease

## 2021-04-20 ENCOUNTER — Other Ambulatory Visit: Payer: Self-pay

## 2021-04-20 DIAGNOSIS — G35 Multiple sclerosis: Secondary | ICD-10-CM

## 2021-04-20 NOTE — Telephone Encounter (Signed)
Auth Submission: NO AUTH REQUIRED/APPROVED Payer: [employer]/MED[employer]ARE Medication & CPT/J Code(s) submitted: Solumedrol (Methylprednisolone) J2930 Route of submission (phone, fax, portal): PHONE [phone] Auth type: Buy/Bill Units/visits requested: 3 Reference number: [id number]

## 2021-04-20 NOTE — Progress Notes (Signed)
Received fax order solumedrol 1000 mg IV daily x 3 days for MS from his neurologist Dr. Kingsley Spittle MD  Orders placed to get the treatment at the infusion center.  Chilton Greathouse MD [facility] 04/20/2021, 2:08 PM

## 2021-04-21 ENCOUNTER — Ambulatory Visit (INDEPENDENT_AMBULATORY_CARE_PROVIDER_SITE_OTHER): Payer: Medicare HMO

## 2021-04-21 ENCOUNTER — Other Ambulatory Visit: Payer: Self-pay

## 2021-04-21 VITALS — BP 145/84 | HR 56 | Temp 97.6°F

## 2021-04-21 DIAGNOSIS — G35 Multiple sclerosis: Secondary | ICD-10-CM | POA: Diagnosis not present

## 2021-04-21 MED ORDER — METHYLPREDNISOLONE SODIUM SUCC 1000 MG IJ SOLR
1000.0000 mg | Freq: Once | INTRAMUSCULAR | Status: AC
  Administered 2021-04-21: 1000 mg via INTRAVENOUS
  Filled 2021-04-21: qty 8

## 2021-04-21 NOTE — Patient Instructions (Signed)
[facility] Neurology [address], [place], [place] [address] Phone: [phone]

## 2021-04-21 NOTE — Progress Notes (Signed)
Diagnosis: Multiple Sclerosis  Provider:  Chilton Greathouse, MD  Procedure: Infusion  IV Type: Peripheral, IV Location: R Hand  Solumedrol (Methylprednisolone), Dose: 1000 mg  Infusion Start Time: 0917  Infusion Stop Time: 1017  Post Infusion IV Care: Peripheral IV Discontinued  Discharge: Condition: Good, Destination: Home . AVS provided to patient.   Performed by:  Nat Math, RN

## 2021-04-22 ENCOUNTER — Ambulatory Visit (INDEPENDENT_AMBULATORY_CARE_PROVIDER_SITE_OTHER): Payer: Medicare HMO

## 2021-04-22 VITALS — BP 155/89 | HR 55 | Temp 97.8°F | Resp 20

## 2021-04-22 DIAGNOSIS — G35 Multiple sclerosis: Secondary | ICD-10-CM

## 2021-04-22 MED ORDER — SODIUM CHLORIDE 0.9 % IV SOLN
1000.0000 mg | Freq: Once | INTRAVENOUS | Status: AC
  Administered 2021-04-22: 1000 mg via INTRAVENOUS
  Filled 2021-04-22: qty 8

## 2021-04-22 NOTE — Progress Notes (Signed)
Diagnosis: Multiple Sclerosis  Provider:  Chilton Greathouse, MD  Procedure: Infusion  IV Type: Peripheral, IV Location: R Hand  Solumedrol (Methylprednisolone), Dose: 1000 mg  Infusion Start Time: 0935  Infusion Stop Time: 1035  Post Infusion IV Care: Peripheral IV Discontinued  Discharge: Condition: Good, Destination: Home . AVS provided to patient.   Performed by:  Nat Math, RN

## 2021-04-23 ENCOUNTER — Other Ambulatory Visit: Payer: Self-pay

## 2021-04-23 ENCOUNTER — Ambulatory Visit (INDEPENDENT_AMBULATORY_CARE_PROVIDER_SITE_OTHER): Payer: Medicare HMO | Admitting: *Deleted

## 2021-04-23 VITALS — BP 150/86 | HR 85 | Temp 98.2°F | Resp 20

## 2021-04-23 DIAGNOSIS — G35 Multiple sclerosis: Secondary | ICD-10-CM

## 2021-04-23 MED ORDER — SODIUM CHLORIDE 0.9 % IV SOLN
1000.0000 mg | Freq: Once | INTRAVENOUS | Status: AC
  Administered 2021-04-23: 1000 mg via INTRAVENOUS
  Filled 2021-04-23: qty 8

## 2021-04-23 NOTE — Progress Notes (Signed)
     Diagnosis: Multiple Sclerosis  Provider:  Chilton Greathouse, MD  Procedure: Infusion  IV Type: Peripheral, IV Location: L Hand  Solumedrol, Dose: 1000 mg  Infusion Start Time: 9:16am  Infusion Stop Time: 10:23  Post Infusion IV Care: Observation period completed  Discharge: Condition: Good, Destination: Home . AVS provided to patient.   Performed by:  Forrest Moron, RN

## 2021-04-24 ENCOUNTER — Telehealth: Payer: Self-pay | Admitting: Pharmacy Technician

## 2021-04-24 NOTE — Telephone Encounter (Addendum)
Auth Submission: APPROVED  Payer: [employer]/MEDICARE Medication & CPT/J Code(s) submitted: Emogene Morgan [facility]) [phone] Route of submission (phone, fax, portal): AVAILTY PORTAL Auth type: Buy/Bill Units/visits requested: 6 months Reference number [id number]

## 2021-04-27 ENCOUNTER — Other Ambulatory Visit: Payer: Self-pay | Admitting: Pharmacy Technician

## 2021-04-27 NOTE — Telephone Encounter (Signed)
error 

## 2021-05-14 ENCOUNTER — Other Ambulatory Visit: Payer: Self-pay

## 2021-05-14 ENCOUNTER — Ambulatory Visit (INDEPENDENT_AMBULATORY_CARE_PROVIDER_SITE_OTHER): Payer: Medicare HMO | Admitting: *Deleted

## 2021-05-14 VITALS — BP 118/70 | HR 68 | Temp 98.1°F | Resp 20

## 2021-05-14 DIAGNOSIS — G35 Multiple sclerosis: Secondary | ICD-10-CM | POA: Diagnosis not present

## 2021-05-14 MED ORDER — HEPARIN SOD (PORK) LOCK FLUSH 100 UNIT/ML IV SOLN: 250.0000 [IU] | Freq: Once | INTRAVENOUS | Status: DC | PRN

## 2021-05-14 MED ORDER — SODIUM CHLORIDE 0.9 % IV SOLN
600.0000 mg | Freq: Once | INTRAVENOUS | Status: AC
  Administered 2021-05-14: 600 mg via INTRAVENOUS
  Filled 2021-05-14 (×2): qty 20

## 2021-05-14 MED ORDER — SODIUM CHLORIDE 0.9% FLUSH: 10.0000 mL | Freq: Once | INTRAVENOUS | Status: DC | PRN

## 2021-05-14 MED ORDER — EPINEPHRINE 0.3 MG/0.3ML IJ SOAJ: 0.3000 mg | Freq: Once | INTRAMUSCULAR | Status: DC | PRN

## 2021-05-14 MED ORDER — METHYLPREDNISOLONE SODIUM SUCC 125 MG IJ SOLR
125.0000 mg | Freq: Once | INTRAMUSCULAR | Status: AC
  Administered 2021-05-14: 125 mg via INTRAVENOUS
  Filled 2021-05-14: qty 2

## 2021-05-14 MED ORDER — ALTEPLASE 2 MG IJ SOLR: 2.0000 mg | Freq: Once | INTRAMUSCULAR | Status: DC | PRN

## 2021-05-14 MED ORDER — METHYLPREDNISOLONE SODIUM SUCC 125 MG IJ SOLR: 125.0000 mg | Freq: Once | INTRAMUSCULAR | Status: DC | PRN

## 2021-05-14 MED ORDER — ANTICOAGULANT SODIUM CITRATE 4% (200MG/5ML) IV SOLN
5.0000 mL | Freq: Once | Status: DC | PRN
  Filled 2021-05-14: qty 5

## 2021-05-14 MED ORDER — HEPARIN SOD (PORK) LOCK FLUSH 100 UNIT/ML IV SOLN: 500.0000 [IU] | Freq: Once | INTRAVENOUS | Status: DC | PRN

## 2021-05-14 MED ORDER — SODIUM CHLORIDE 0.9% FLUSH: 3.0000 mL | Freq: Once | INTRAVENOUS | Status: DC | PRN

## 2021-05-14 MED ORDER — DIPHENHYDRAMINE HCL 50 MG/ML IJ SOLN: 50.0000 mg | Freq: Once | INTRAMUSCULAR | Status: DC | PRN

## 2021-05-14 MED ORDER — FAMOTIDINE IN NACL 20-0.9 MG/50ML-% IV SOLN: 20.0000 mg | Freq: Once | INTRAVENOUS | Status: DC | PRN

## 2021-05-14 MED ORDER — ACETAMINOPHEN 325 MG PO TABS: 650.0000 mg | ORAL_TABLET | Freq: Once | ORAL | Status: DC

## 2021-05-14 MED ORDER — DIPHENHYDRAMINE HCL 25 MG PO CAPS
50.0000 mg | ORAL_CAPSULE | Freq: Once | ORAL | Status: AC
Start: 2021-05-14 — End: 2021-05-14
  Administered 2021-05-14: 50 mg via ORAL
  Filled 2021-05-14: qty 2

## 2021-05-14 MED ORDER — SODIUM CHLORIDE 0.9 % IV SOLN: Freq: Once | INTRAVENOUS | Status: DC | PRN

## 2021-05-14 MED ORDER — ALBUTEROL SULFATE HFA 108 (90 BASE) MCG/ACT IN AERS: 2.0000 | INHALATION_SPRAY | Freq: Once | RESPIRATORY_TRACT | Status: DC | PRN

## 2021-05-14 NOTE — Progress Notes (Signed)
Diagnosis: Multiple Sclerosis  Provider:  Chilton Greathouse, MD  Procedure: Infusion  IV Type: Peripheral, IV Location: R Hand  Ocrevus (Ocrelizumab), Dose: 600 mg  Infusion Start Time: 0935  Infusion Stop Time: 1328  Post Infusion IV Care: Observation period completed and Peripheral IV Discontinued  Discharge: Condition: Good, Destination: Home . AVS provided to patient.   Performed by:  Robb Matar, RN
